# Patient Record
Sex: Female | Born: 2000 | Race: White | Hispanic: No | Marital: Single | Smoking: Current every day smoker
Health system: Southern US, Community
[De-identification: ages and names within clinical notes are randomized; demographics above are authoritative.]

## PROBLEM LIST (undated history)

## (undated) DIAGNOSIS — T7840XA Allergy, unspecified, initial encounter: Secondary | ICD-10-CM

## (undated) DIAGNOSIS — H60399 Other infective otitis externa, unspecified ear: Secondary | ICD-10-CM

## (undated) HISTORY — PX: TONSILLECTOMY: SUR1361

## (undated) HISTORY — DX: Allergy, unspecified, initial encounter: T78.40XA

---
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
## 2022-08-01 ENCOUNTER — Emergency Department (HOSPITAL_COMMUNITY)
Admission: EM | Admit: 2022-08-01 | Discharge: 2022-08-01 | Disposition: A | Discharge status: Home / Self Care | Payer: BC Managed Care – PPO | Attending: Emergency Medicine | Admitting: Emergency Medicine

## 2022-08-01 ENCOUNTER — Other Ambulatory Visit: Payer: Self-pay

## 2022-08-01 ENCOUNTER — Encounter (HOSPITAL_COMMUNITY): Payer: Self-pay

## 2022-08-01 DIAGNOSIS — K0889 Other specified disorders of teeth and supporting structures: Secondary | ICD-10-CM | POA: Insufficient documentation

## 2022-08-01 MED ORDER — IBUPROFEN 600 MG PO TABS: 600.0000 mg | ORAL_TABLET | Freq: Four times a day (QID) | ORAL | 0 refills | Status: AC | PRN

## 2022-08-01 MED ORDER — AMOXICILLIN-POT CLAVULANATE 875-125 MG PO TABS: 1.0000 | ORAL_TABLET | Freq: Two times a day (BID) | ORAL | 0 refills | Status: AC

## 2022-08-01 NOTE — Discharge Instructions (Signed)
As discussed, will treat dental pain with antibiotics and anti-inflammatory medication.  Attached is information for dental resources; recommend calling a dental specialist at your earliest convenience to set up an appointment.  Please do not hesitate to return to emergency department for worrisome signs and symptoms we discussed become apparent.

## 2022-08-01 NOTE — ED Provider Notes (Signed)
[place] EMERGENCY DEPARTMENT AT [facility] Provider Note   CSN: [id number] Arrival date & time: 08/01/22  1415     History  Chief Complaint  Patient presents with   Dental Pain    Michelle Kerr is a 22 y.o. female.   Dental Pain   22 year old female presents emergency department with complaints of left upper and lower dental pain.  Patient states that symptoms been present for the past 2 to 3 days.  States he tried over-the-counter Tylenol as well as Motrin with some relief of symptoms.  Presents emergency department for further evaluation.  Denies any fever, facial swelling, difficulty breathing/swallowing.  Denies any known fractured teeth on affected side or known cavities.  Does not have dental insurance and has not seen dental specialist for several years.  No significant pertinent past medical history.  Home Medications Prior to Admission medications   Medication Sig Start Date End Date Taking? Authorizing Provider  amoxicillin-clavulanate (AUGMENTIN) 875-125 MG tablet Take 1 tablet by mouth every 12 (twelve) hours. 08/01/22  Yes Sherian Maroon A, PA  ibuprofen (ADVIL) 600 MG tablet Take 1 tablet (600 mg total) by mouth every 6 (six) hours as needed. 08/01/22  Yes Peter Garter, PA  desogestrel-ethinyl estradiol (ISIBLOOM) 0.15-30 MG-MCG tablet TAKE ONE (1) TABLET EACH DAY 10/20/18   Remus Loffler, PA-C  hydrocortisone 2.5 % cream APPLY TWICE DAILY 12/06/18   Remus Loffler, PA-C  hydrocortisone cream 1 % Apply 1 application topically 2 (two) times daily. 10/20/18   Remus Loffler, PA-C  loratadine (CLARITIN) 10 MG tablet Take 1 tablet (10 mg total) by mouth at bedtime. 10/20/18   Remus Loffler, PA-C      Allergies    Patient has no known allergies.    Review of Systems   Review of Systems  All other systems reviewed and are negative.   Physical Exam Updated Vital Signs BP 116/83 (BP Location: Right Arm)   Pulse 72   Temp 98.3 F (36.8 C) (Oral)    Resp 14   Ht 5\' 6"  (1.676 m)   Wt 82.5 kg   LMP 07/20/2022 (Exact Date)   SpO2 98%   BMI 29.36 kg/m  Physical Exam Vitals and nursing note reviewed.  Constitutional:      General: She is not in acute distress.    Appearance: She is well-developed.  HENT:     Head: Normocephalic and atraumatic.     Mouth/Throat:      Comments: Uvula midline rise symmetric with phonation.  Tonsils absent.  No sublingual or submandibular swelling.  No trismus.  No changes in phonation per patient.  No posterior pharyngeal erythema.  No obvious area of fluctuance along left-sided gingiva.  Affected teeth as indicated above.  No obvious fracture and with possible dental caries.  External gingiva tenderness to palpation. Eyes:     Conjunctiva/sclera: Conjunctivae normal.  Cardiovascular:     Rate and Rhythm: Normal rate and regular rhythm.     Heart sounds: No murmur heard. Pulmonary:     Effort: Pulmonary effort is normal. No respiratory distress.     Breath sounds: Normal breath sounds.  Abdominal:     Palpations: Abdomen is soft.     Tenderness: There is no abdominal tenderness.  Musculoskeletal:        General: No swelling.     Cervical back: Neck supple.  Skin:    General: Skin is warm and dry.     Capillary Refill:  Capillary refill takes less than 2 seconds.  Neurological:     Mental Status: She is alert.  Psychiatric:        Mood and Affect: Mood normal.     ED Results / Procedures / Treatments   Labs (all labs ordered are listed, but only abnormal results are displayed) Labs Reviewed - No data to display  EKG None  Radiology No results found.  Procedures Procedures    Medications Ordered in ED Medications - No data to display  ED Course/ Medical Decision Making/ A&P                             Medical Decision Making Risk Prescription drug management.   This patient presents to the ED for concern of dental pain, this involves an extensive number of treatment  options, and is a complaint that carries with it a high risk of complications and morbidity.  The differential diagnosis includes cellulitis, erysipelas, abscess, necrotizing ulcerative gingivitis, Ludwig angina, Lemierre's disease, dental caries, fractured tooth, periapical abscess   Co morbidities that complicate the patient evaluation  See HPI   Additional history obtained:  Additional history obtained from EMR External records from outside source obtained and reviewed including hospital records   Lab Tests:  N/a   Imaging Studies ordered:  N/a   Cardiac Monitoring: / EKG:  The patient was maintained on a cardiac monitor.  I personally viewed and interpreted the cardiac monitored which showed an underlying rhythm of: Sinus rhythm   Consultations Obtained:  N/a   Problem List / ED Course / Critical interventions / Medication management  Dental pain Reevaluation of the patient showed that the patient stayed the same I have reviewed the patients home medicines and have made adjustments as needed   Social Determinants of Health:  Reports chronic e-cigarette use daily.   Test / Admission - Considered:  Dental pain Vitals signs within normal range and stable throughout visit. 22 year old female presents emergency department with complaints of dental pain.  Patient without clinical evidence of Ludwig angina or appreciable abscess on exam.  Given overlying gingival tenderness to palpation with appreciable dental caries, will treat empirically with antibiotics and nonsteroidal anti-inflammatory medication recommend close follow-up with dental specialist in the outpatient setting.  Further workup deemed unnecessary at this time on the emergency department.  Treatment plan discussed at length with patient and she acknowledged understanding was agreeable to said plan.  Patient overall well-appearing, afebrile in no acute distress, eating p.o. without difficulty. Worrisome  signs and symptoms were discussed with the patient, and the patient acknowledged understanding to return to the ED if noticed. Patient was stable upon discharge.          Final Clinical Impression(s) / ED Diagnoses Final diagnoses:  Pain, dental    Rx / DC Orders ED Discharge Orders          Ordered    amoxicillin-clavulanate (AUGMENTIN) 875-125 MG tablet  Every 12 hours        08/01/22 1444    ibuprofen (ADVIL) 600 MG tablet  Every 6 hours PRN        08/01/22 1444              Peter Garter, [place] 08/01/22 1524    Terrilee Files, MD 08/01/22 1739

## 2022-08-01 NOTE — ED Triage Notes (Signed)
Pt comes in with complaints of wisdom teeth toothache. Pt states pain started 3 days ago and no otc medications are helping the pain. Pt states tooth ache was worse last night at work that is what brought her in. Pt denies any facial swelling.
